# Patient Record
Sex: Male | Born: 1995 | Race: White | Hispanic: No | Marital: Single | Smoking: Never smoker
Health system: Southern US, Community
[De-identification: ages and names within clinical notes are randomized; demographics above are authoritative.]

---
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
## 2019-08-28 ENCOUNTER — Ambulatory Visit
Admission: EM | Admit: 2019-08-28 | Discharge: 2019-08-28 | Disposition: A | Discharge status: Home / Self Care | Payer: Self-pay | Attending: Emergency Medicine | Admitting: Emergency Medicine

## 2019-08-28 ENCOUNTER — Other Ambulatory Visit: Payer: Self-pay

## 2019-08-28 DIAGNOSIS — H66001 Acute suppurative otitis media without spontaneous rupture of ear drum, right ear: Secondary | ICD-10-CM

## 2019-08-28 DIAGNOSIS — H60333 Swimmer's ear, bilateral: Secondary | ICD-10-CM

## 2019-08-28 MED ORDER — NEOMYCIN-POLYMYXIN-HC 3.5-10000-1 OT SUSP: 4.0000 [drp] | Freq: Three times a day (TID) | OTIC | 0 refills | Status: AC

## 2019-08-28 MED ORDER — AMOXICILLIN 500 MG PO CAPS: 500.0000 mg | ORAL_CAPSULE | Freq: Two times a day (BID) | ORAL | 0 refills | Status: AC

## 2019-08-28 NOTE — Discharge Instructions (Addendum)
Rest and drink plenty of fluids Prescribed amoxicillin.  Take as directed and to completion  Prescribed cortisporin ear drops.  Use as directed and to completion Use OTC ibuprofen and/ or tylenol as needed for pain control Follow up with PCP if symptoms persists Return here or go to the ER if you have any new or worsening symptoms fever, chills, redness, swelling, discharge, nausea, vomiting, cough, chest pain, shortness of breath, symptoms do not improve despite medications, etc...  COVID test ordered.  Someone will call you with abnormal results.  Low to no suspicion for COVID.

## 2019-08-28 NOTE — ED Provider Notes (Signed)
[facility]   [id number] 08/28/19 Arrival Time: 1347  CC: EAR PAIN  SUBJECTIVE: History from: patient.  Steven Obrien is a 23 y.o. male who presents with of bilateral ear pain, R>L x 2-3 days.  [place]. Denies sick exposure to COVID, flu or strep.  Denies recent travel.  Patient states the pain is intermittent and 8/10 in character.  Patient has tried OTC peroxide and sweet oil without relief.  Symptoms are made worse to the touch.  Denies similar symptoms in the past.  Denies fever, chills, fatigue, sinus pain, rhinorrhea, ear discharge, sore throat, cough, SOB, wheezing, chest pain, nausea, vomiting, changes in bowel or bladder habits.    Also requests COVID testing.    ROS: As per HPI.  All other pertinent ROS negative.     History reviewed. No pertinent past medical history. History reviewed. No pertinent surgical history. No Known Allergies No current facility-administered medications on file prior to encounter.    Current Outpatient Medications on File Prior to Encounter  Medication Sig Dispense Refill  . [DISCONTINUED] amphetamine-dextroamphetamine (ADDERALL XR) 25 MG 24 hr capsule Take 1 capsule (25 mg total) by mouth every morning. 30 capsule 0   Social History   Socioeconomic History  . Marital status: Single    Spouse name: Not on file  . Number of children: Not on file  . Years of education: Not on file  . Highest education level: Not on file  Occupational History  . Not on file  Social Needs  . Financial resource strain: Not on file  . Food insecurity    Worry: Not on file    Inability: Not on file  . Transportation needs    Medical: Not on file    Non-medical: Not on file  Tobacco Use  . Smoking status: Never Smoker  . Smokeless tobacco: Never Used  Substance and Sexual Activity  . Alcohol use: No  . Drug use: No  . Sexual activity: Not on file  Lifestyle  . Physical activity    Days per week: Not on file   Minutes per session: Not on file  . Stress: Not on file  Relationships  . Social Musicianconnections    Talks on phone: Not on file    Gets together: Not on file    Attends religious service: Not on file    Active member of club or organization: Not on file    Attends meetings of clubs or organizations: Not on file    Relationship status: Not on file  . Intimate partner violence    Fear of current or ex partner: Not on file    Emotionally abused: Not on file    Physically abused: Not on file    Forced sexual activity: Not on file  Other Topics Concern  . Not on file  Social History Narrative  . Not on file   History reviewed. No pertinent family history.  OBJECTIVE:  Vitals:   08/28/19 1402  BP: (!) 146/90  Pulse: (!) 101  Resp: 18  Temp: 98.6 F (37 C)  SpO2: 97%     General appearance: alert; well-appearing HEENT: Ears: Bilateral EACs appear erythematous and swollen R>L, RT TM appears erythematous and tense, LT TM pearly gray with visible cone of light, without erythema, TTP with auricle manipulation; Eyes: PERRL, EOMI grossly; Nose: patent without rhinorrhea; Throat: oropharynx clear, tonsils not enlarged or erythematous without white tonsillar exudates, uvula midline Neck: supple without LAD  Lungs: unlabored respirations, symmetrical air entry; cough: absent; no respiratory distress Heart: regular rate and rhythm.  Radial pulses 2+ symmetrical bilaterally Skin: warm and dry Psychological: alert and cooperative; normal mood and affect  ASSESSMENT & PLAN:  1. Acute swimmer's ear of both sides   2. Non-recurrent acute suppurative otitis media of right ear without spontaneous rupture of tympanic membrane     Meds ordered this encounter  Medications  . amoxicillin (AMOXIL) 500 MG capsule    Sig: Take 1 capsule (500 mg total) by mouth 2 (two) times daily for 10 days.    Dispense:  20 capsule    Refill:  0    Order Specific Question:   Supervising Provider    Answer:    Raylene Everts [[id number]]  . neomycin-polymyxin-hydrocortisone (CORTISPORIN) 3.5-10000-1 OTIC suspension    Sig: Place 4 drops into both ears 3 (three) times daily for 10 days.    Dispense:  10 mL    Refill:  0    Order Specific Question:   Supervising Provider    Answer:   Raylene Everts [[id number]]   Rest and drink plenty of fluids Prescribed amoxicillin.  Take as directed and to completion  Prescribed cortisporin ear drops.  Use as directed and to completion Use OTC ibuprofen and/ or tylenol as needed for pain control Follow up with PCP if symptoms persists Return here or go to the ER if you have any new or worsening symptoms fever, chills, redness, swelling, discharge, nausea, vomiting, cough, chest pain, shortness of breath, symptoms do not improve despite medications, etc...  COVID test ordered.  Someone will call you with abnormal results.  Low to no suspicion for COVID.    Reviewed expectations re: course of current medical issues. Questions answered. Outlined signs and symptoms indicating need for more acute intervention. Patient verbalized understanding. After Visit Summary given.         Lestine Box, PA-C 08/28/19 1423

## 2019-08-28 NOTE — ED Triage Notes (Signed)
Pt began having ear pain [place] , right ear is worse than left

## 2019-08-30 LAB — NOVEL CORONAVIRUS, NAA: SARS-CoV-2, NAA: NOT DETECTED
